# Patient Record
Sex: Female | Born: 1975 | Race: Asian | Hispanic: No | Marital: Married | Smoking: Never smoker
Health system: Southern US, Community
[De-identification: ages and names within clinical notes are randomized; demographics above are authoritative.]

## PROBLEM LIST (undated history)

## (undated) DIAGNOSIS — E039 Hypothyroidism, unspecified: Secondary | ICD-10-CM

## (undated) DIAGNOSIS — K589 Irritable bowel syndrome without diarrhea: Secondary | ICD-10-CM

## (undated) DIAGNOSIS — J309 Allergic rhinitis, unspecified: Secondary | ICD-10-CM

## (undated) DIAGNOSIS — E7800 Pure hypercholesterolemia, unspecified: Secondary | ICD-10-CM

## (undated) DIAGNOSIS — F331 Major depressive disorder, recurrent, moderate: Secondary | ICD-10-CM

## (undated) DIAGNOSIS — N643 Galactorrhea not associated with childbirth: Secondary | ICD-10-CM

## (undated) DIAGNOSIS — F32A Depression, unspecified: Secondary | ICD-10-CM

## (undated) HISTORY — DX: Pure hypercholesterolemia, unspecified: E78.00

## (undated) HISTORY — DX: Irritable bowel syndrome, unspecified: K58.9

## (undated) HISTORY — DX: Major depressive disorder, recurrent, moderate: F33.1

## (undated) HISTORY — DX: Galactorrhea not associated with childbirth: N64.3

## (undated) HISTORY — DX: Depression, unspecified: F32.A

## (undated) HISTORY — DX: Hypothyroidism, unspecified: E03.9

## (undated) HISTORY — DX: Allergic rhinitis, unspecified: J30.9

---

## 2006-07-08 ENCOUNTER — Inpatient Hospital Stay (HOSPITAL_COMMUNITY): Admission: AD | Admit: 2006-07-08 | Discharge: 2006-07-08 | Payer: Self-pay | Admitting: Obstetrics and Gynecology

## 2007-09-23 IMAGING — US US PELVIS COMPLETE MODIFY
1 series · 14 of 25 positions shown · non-contrast
Comparison: none

CLINICAL DATA: Right-sided pelvic pain, abnormal uterine bleeding.  
 TRANSABDOMINAL AND TRANSVAGINAL PELVIC ULTRASOUND:
TECHNIQUE: Both transabdominal and transvaginal ultrasound examinations of the pelvis were performed including evaluation of the uterus, ovaries, adnexal regions, and pelvic cul-de-sac.

[Series 1: us pelvis complete modify · 0.24mm/px · 14 of 85 slices shown]
[im 1/85]
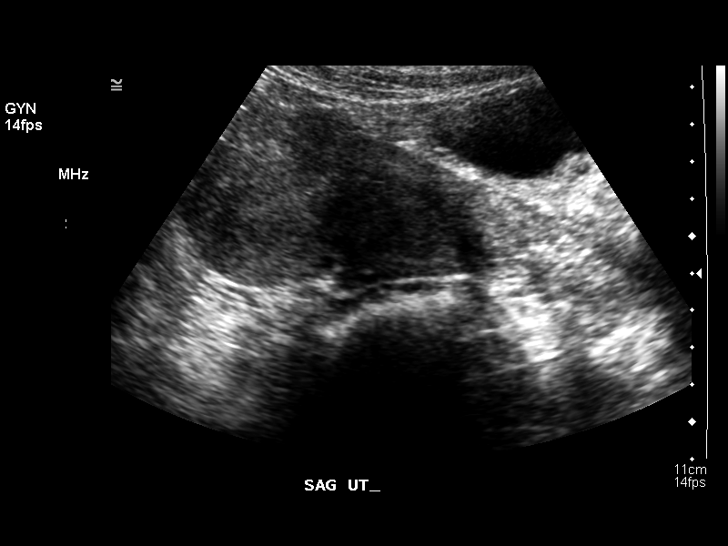
[im 8/85]
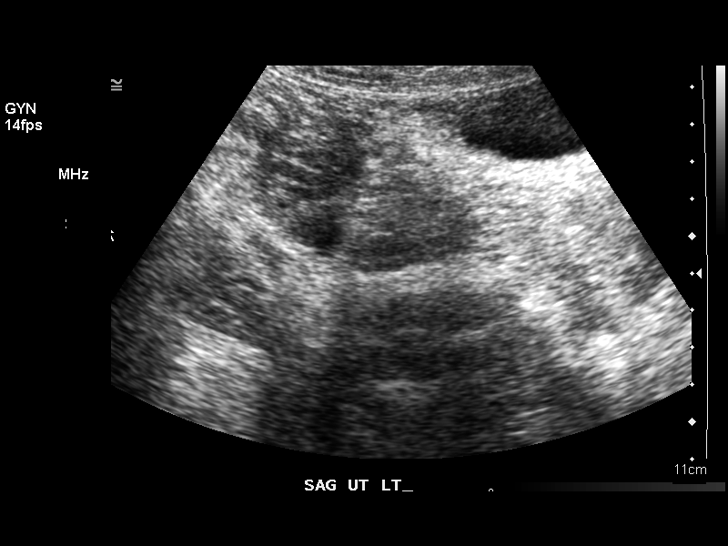
[im 15/85]
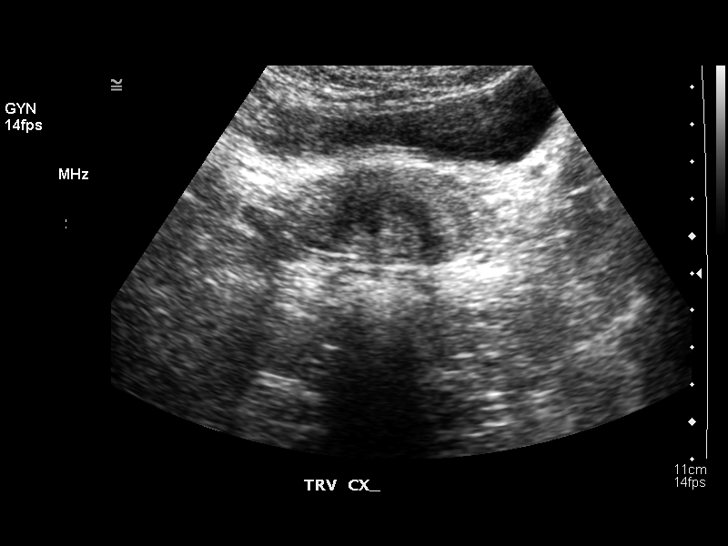
[im 22/85]
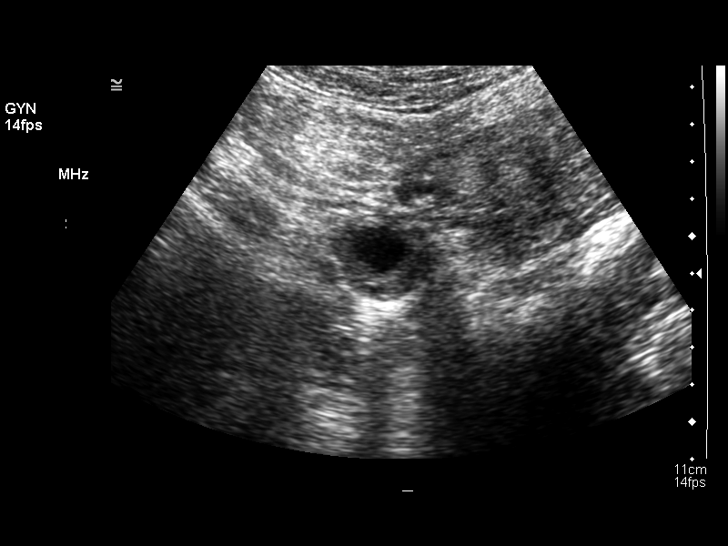
[im 29/85]
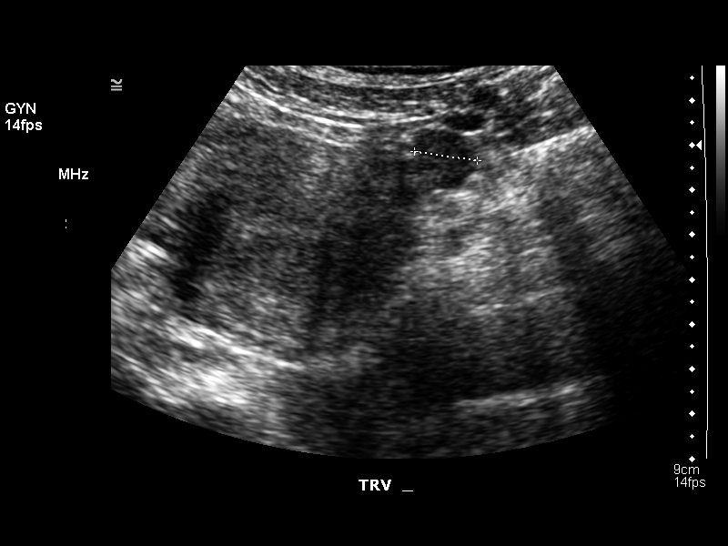
[im 32/85]
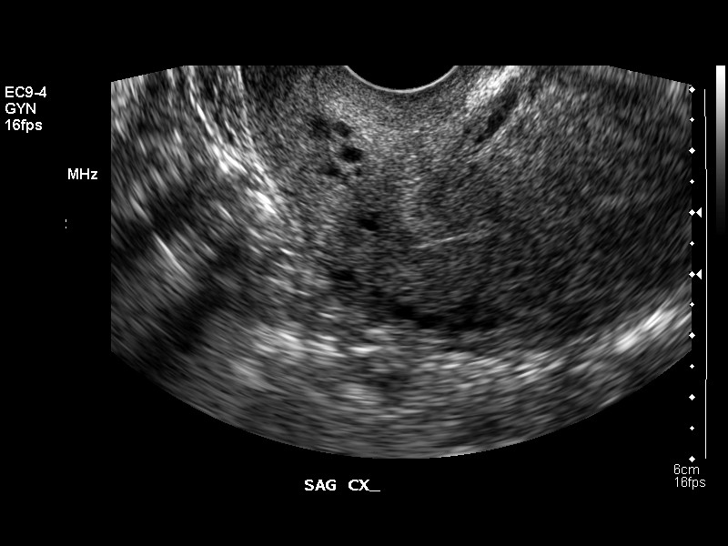
[im 39/85]
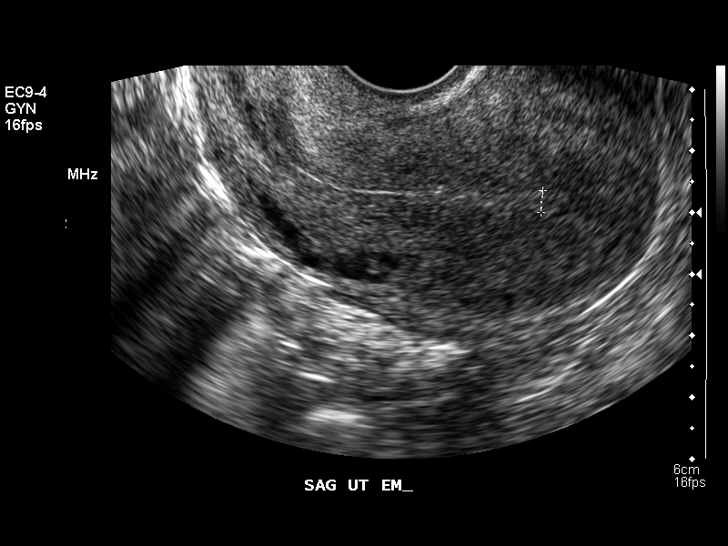
[im 46/85]
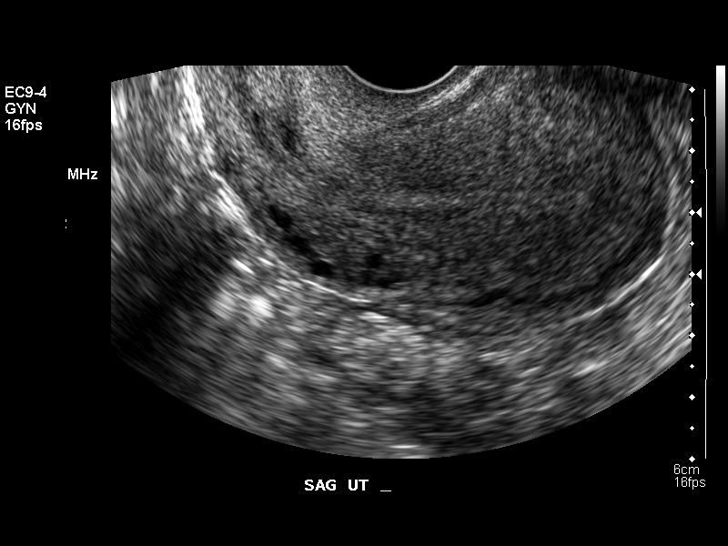
[im 53/85]
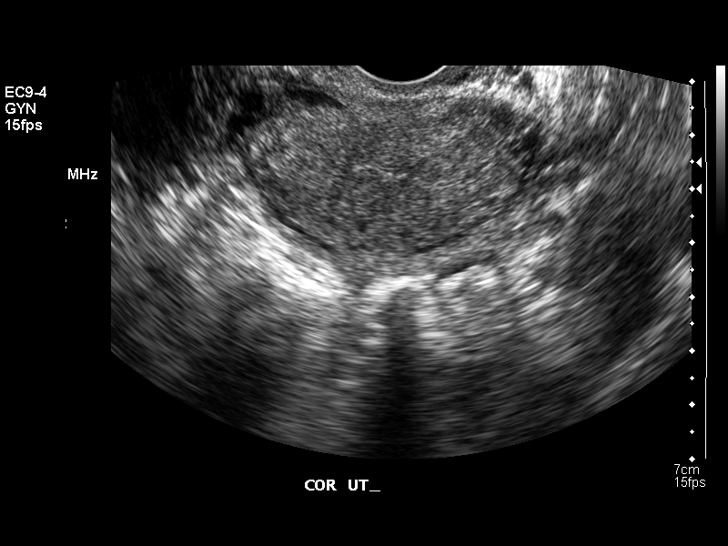
[im 57/85]
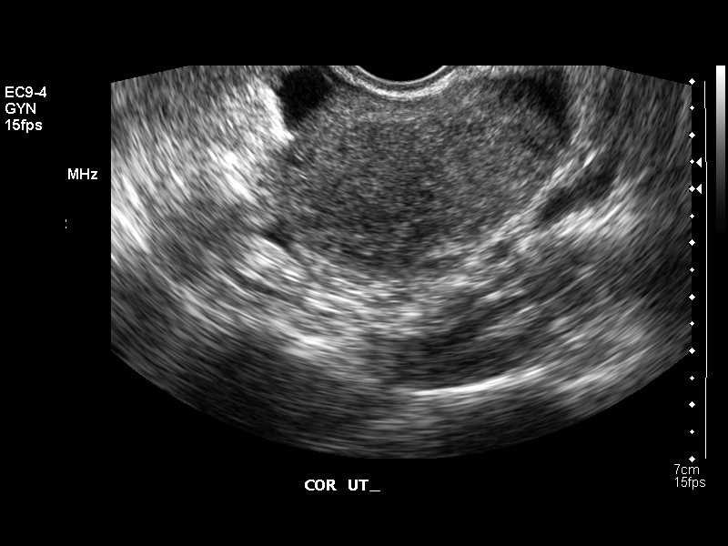
[im 64/85]
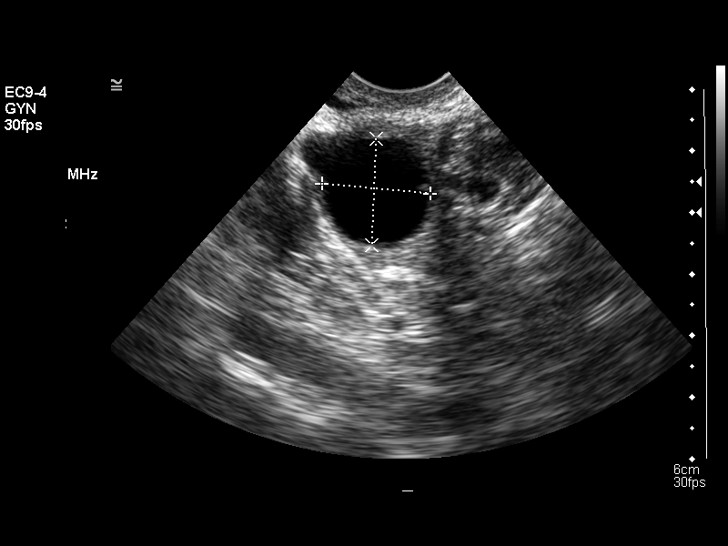
[im 71/85]
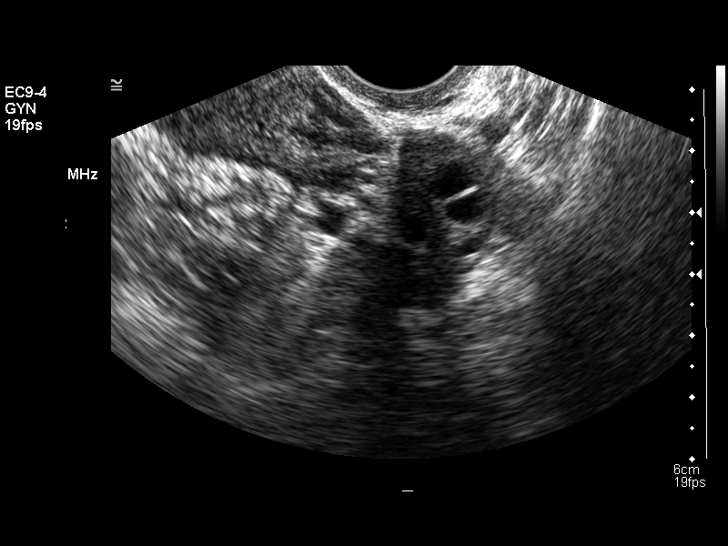
[im 78/85]
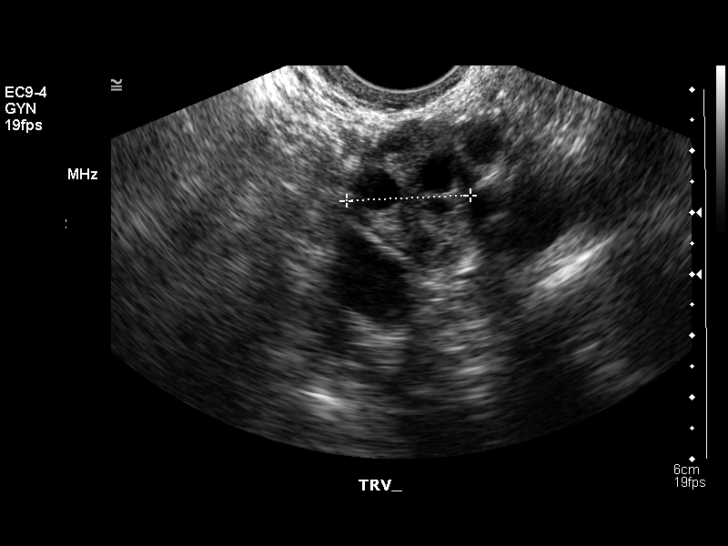
[im 85/85]
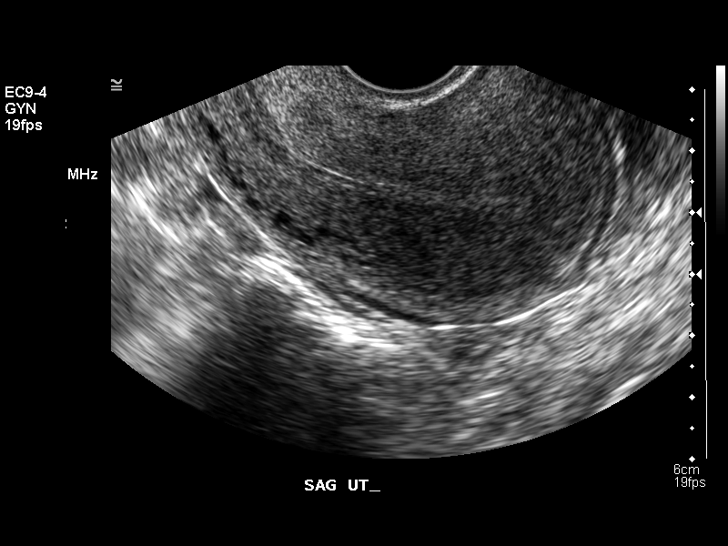

[14 of 25 positions shown; findings below may reference images not displayed]

FINDINGS: The uterus is retroverted but is normal in size measuring approximately 9 cm in length.  No fibroids or other uterine masses are seen.  Thin endometrium is seen, measuring 4 mm.  
 Both ovaries are visualized and are normal in appearance.  There is no evidence of ovarian masses.  No other adnexal masses or free fluid are identified by either transabdominal or transvaginal ultrasound.
IMPRESSION: 1.  Normal retroverted uterus.  Thin endometrium measuring 4 mm.
 2.  Normal ovaries.  No evidence of adnexal mass or free fluid.

## 2007-12-01 ENCOUNTER — Ambulatory Visit: Payer: Self-pay | Admitting: Family Medicine

## 2007-12-01 ENCOUNTER — Encounter: Payer: Self-pay | Admitting: Family Medicine

## 2007-12-01 LAB — CONVERTED CEMR LAB
Antibody Screen: NEGATIVE
Basophils Absolute: 0 10*3/uL (ref 0.0–0.1)
Basophils Relative: 0 % (ref 0–1)
Eosinophils Absolute: 0 10*3/uL (ref 0.0–0.7)
Hemoglobin: 12 g/dL (ref 12.0–15.0)
MCHC: 34.5 g/dL (ref 30.0–36.0)
MCV: 91.3 fL (ref 78.0–100.0)
Monocytes Absolute: 0.5 10*3/uL (ref 0.1–1.0)
Monocytes Relative: 6 % (ref 3–12)
RBC: 3.81 M/uL — ABNORMAL LOW (ref 3.87–5.11)
RDW: 14.3 % (ref 11.5–15.5)

## 2007-12-08 ENCOUNTER — Ambulatory Visit: Payer: Self-pay | Admitting: Family Medicine

## 2007-12-16 ENCOUNTER — Ambulatory Visit: Payer: Self-pay | Admitting: Family Medicine

## 2007-12-16 ENCOUNTER — Encounter: Payer: Self-pay | Admitting: Family Medicine

## 2007-12-16 LAB — CONVERTED CEMR LAB
GTT, 1 hr: 120 mg/dL
Protein, U semiquant: NEGATIVE

## 2008-01-12 ENCOUNTER — Encounter: Payer: Self-pay | Admitting: Family Medicine

## 2008-02-15 ENCOUNTER — Ambulatory Visit: Payer: Self-pay | Admitting: Family Medicine

## 2008-02-15 LAB — CONVERTED CEMR LAB: Protein, U semiquant: NEGATIVE

## 2008-03-07 ENCOUNTER — Telehealth: Payer: Self-pay | Admitting: *Deleted

## 2008-03-16 ENCOUNTER — Ambulatory Visit: Payer: Self-pay | Admitting: Family Medicine

## 2008-03-16 ENCOUNTER — Encounter: Payer: Self-pay | Admitting: Family Medicine

## 2008-03-16 LAB — CONVERTED CEMR LAB: Glucose, Urine, Semiquant: NEGATIVE

## 2008-03-17 ENCOUNTER — Encounter: Payer: Self-pay | Admitting: Family Medicine

## 2008-03-17 ENCOUNTER — Ambulatory Visit: Payer: Self-pay | Admitting: Family Medicine

## 2008-04-05 ENCOUNTER — Ambulatory Visit: Payer: Self-pay | Admitting: Family Medicine

## 2008-04-05 ENCOUNTER — Encounter: Payer: Self-pay | Admitting: Family Medicine

## 2008-04-20 ENCOUNTER — Encounter: Payer: Self-pay | Admitting: Family Medicine

## 2008-04-20 ENCOUNTER — Ambulatory Visit: Payer: Self-pay | Admitting: Family Medicine

## 2008-04-20 LAB — CONVERTED CEMR LAB: Glucose, Urine, Semiquant: NEGATIVE

## 2008-05-02 ENCOUNTER — Ambulatory Visit: Payer: Self-pay | Admitting: Family Medicine

## 2008-05-02 LAB — CONVERTED CEMR LAB
Glucose, Urine, Semiquant: NEGATIVE
Protein, U semiquant: NEGATIVE

## 2008-05-09 ENCOUNTER — Encounter: Payer: Self-pay | Admitting: Family Medicine

## 2008-05-09 ENCOUNTER — Ambulatory Visit: Payer: Self-pay | Admitting: Family Medicine

## 2008-05-09 LAB — CONVERTED CEMR LAB: Protein, U semiquant: NEGATIVE

## 2008-05-16 ENCOUNTER — Encounter: Payer: Self-pay | Admitting: Family Medicine

## 2008-05-16 ENCOUNTER — Ambulatory Visit: Payer: Self-pay | Admitting: Family Medicine

## 2008-05-18 LAB — CONVERTED CEMR LAB: Chlamydia, DNA Probe: NEGATIVE

## 2008-05-19 ENCOUNTER — Telehealth: Payer: Self-pay | Admitting: *Deleted

## 2008-05-20 ENCOUNTER — Ambulatory Visit: Payer: Self-pay | Admitting: Obstetrics & Gynecology

## 2008-05-20 ENCOUNTER — Inpatient Hospital Stay (HOSPITAL_COMMUNITY): Admission: AD | Admit: 2008-05-20 | Discharge: 2008-05-20 | Payer: Self-pay | Admitting: Obstetrics & Gynecology

## 2008-05-23 ENCOUNTER — Encounter: Payer: Self-pay | Admitting: Family Medicine

## 2008-05-23 ENCOUNTER — Ambulatory Visit: Payer: Self-pay | Admitting: Family Medicine

## 2008-05-23 LAB — CONVERTED CEMR LAB
Glucose, Urine, Semiquant: NEGATIVE
Protein, U semiquant: NEGATIVE

## 2008-05-30 ENCOUNTER — Encounter: Payer: Self-pay | Admitting: Family Medicine

## 2008-05-30 ENCOUNTER — Ambulatory Visit: Payer: Self-pay | Admitting: Sports Medicine

## 2008-05-30 LAB — CONVERTED CEMR LAB: Glucose, Urine, Semiquant: NEGATIVE

## 2008-06-09 ENCOUNTER — Encounter: Payer: Self-pay | Admitting: Family Medicine

## 2008-06-09 ENCOUNTER — Ambulatory Visit: Payer: Self-pay | Admitting: Sports Medicine

## 2008-06-16 ENCOUNTER — Inpatient Hospital Stay (HOSPITAL_COMMUNITY): Admission: AD | Admit: 2008-06-16 | Discharge: 2008-06-18 | Payer: Self-pay | Admitting: Family Medicine

## 2008-06-16 ENCOUNTER — Ambulatory Visit: Payer: Self-pay | Admitting: Family Medicine

## 2008-07-26 ENCOUNTER — Encounter: Payer: Self-pay | Admitting: Family Medicine

## 2008-08-04 ENCOUNTER — Encounter: Payer: Self-pay | Admitting: *Deleted

## 2008-08-10 ENCOUNTER — Encounter: Payer: Self-pay | Admitting: *Deleted

## 2011-08-15 LAB — URINALYSIS, ROUTINE W REFLEX MICROSCOPIC
Glucose, UA: NEGATIVE
Hgb urine dipstick: NEGATIVE
Ketones, ur: 40 — AB
pH: 6.5

## 2011-08-16 LAB — CBC
HCT: 33 — ABNORMAL LOW
Hemoglobin: 10.9 — ABNORMAL LOW
RDW: 15.8 — ABNORMAL HIGH
WBC: 9.2

## 2016-02-19 DIAGNOSIS — N644 Mastodynia: Secondary | ICD-10-CM | POA: Diagnosis not present

## 2016-03-11 DIAGNOSIS — L910 Hypertrophic scar: Secondary | ICD-10-CM | POA: Diagnosis not present

## 2016-04-09 DIAGNOSIS — E039 Hypothyroidism, unspecified: Secondary | ICD-10-CM | POA: Diagnosis not present

## 2016-04-16 DIAGNOSIS — E039 Hypothyroidism, unspecified: Secondary | ICD-10-CM | POA: Diagnosis not present

## 2016-04-16 DIAGNOSIS — E7800 Pure hypercholesterolemia, unspecified: Secondary | ICD-10-CM | POA: Diagnosis not present

## 2016-04-16 DIAGNOSIS — K581 Irritable bowel syndrome with constipation: Secondary | ICD-10-CM | POA: Diagnosis not present

## 2016-04-16 DIAGNOSIS — J309 Allergic rhinitis, unspecified: Secondary | ICD-10-CM | POA: Diagnosis not present

## 2016-04-23 DIAGNOSIS — M7632 Iliotibial band syndrome, left leg: Secondary | ICD-10-CM | POA: Diagnosis not present

## 2016-04-23 DIAGNOSIS — M25561 Pain in right knee: Secondary | ICD-10-CM | POA: Diagnosis not present

## 2016-05-11 DIAGNOSIS — O926 Galactorrhea: Secondary | ICD-10-CM | POA: Diagnosis not present

## 2016-05-11 DIAGNOSIS — J0100 Acute maxillary sinusitis, unspecified: Secondary | ICD-10-CM | POA: Diagnosis not present

## 2016-05-11 DIAGNOSIS — E7800 Pure hypercholesterolemia, unspecified: Secondary | ICD-10-CM | POA: Diagnosis not present

## 2016-05-11 DIAGNOSIS — K581 Irritable bowel syndrome with constipation: Secondary | ICD-10-CM | POA: Diagnosis not present

## 2016-06-10 DIAGNOSIS — H6691 Otitis media, unspecified, right ear: Secondary | ICD-10-CM | POA: Diagnosis not present

## 2016-06-10 DIAGNOSIS — E7800 Pure hypercholesterolemia, unspecified: Secondary | ICD-10-CM | POA: Diagnosis not present

## 2016-06-10 DIAGNOSIS — R42 Dizziness and giddiness: Secondary | ICD-10-CM | POA: Diagnosis not present

## 2016-07-01 DIAGNOSIS — Z0000 Encounter for general adult medical examination without abnormal findings: Secondary | ICD-10-CM | POA: Diagnosis not present

## 2016-07-01 DIAGNOSIS — N920 Excessive and frequent menstruation with regular cycle: Secondary | ICD-10-CM | POA: Diagnosis not present

## 2016-07-01 DIAGNOSIS — Z124 Encounter for screening for malignant neoplasm of cervix: Secondary | ICD-10-CM | POA: Diagnosis not present

## 2016-07-01 DIAGNOSIS — Z113 Encounter for screening for infections with a predominantly sexual mode of transmission: Secondary | ICD-10-CM | POA: Diagnosis not present

## 2016-07-11 DIAGNOSIS — N859 Noninflammatory disorder of uterus, unspecified: Secondary | ICD-10-CM | POA: Diagnosis not present

## 2016-07-11 DIAGNOSIS — Z6821 Body mass index (BMI) 21.0-21.9, adult: Secondary | ICD-10-CM | POA: Diagnosis not present

## 2016-07-11 DIAGNOSIS — N924 Excessive bleeding in the premenopausal period: Secondary | ICD-10-CM | POA: Diagnosis not present

## 2016-07-11 DIAGNOSIS — R109 Unspecified abdominal pain: Secondary | ICD-10-CM | POA: Diagnosis not present

## 2016-07-17 DIAGNOSIS — E7800 Pure hypercholesterolemia, unspecified: Secondary | ICD-10-CM | POA: Diagnosis not present

## 2016-07-17 DIAGNOSIS — E039 Hypothyroidism, unspecified: Secondary | ICD-10-CM | POA: Diagnosis not present

## 2016-07-17 DIAGNOSIS — J309 Allergic rhinitis, unspecified: Secondary | ICD-10-CM | POA: Diagnosis not present

## 2016-07-17 DIAGNOSIS — O926 Galactorrhea: Secondary | ICD-10-CM | POA: Diagnosis not present

## 2016-08-05 DIAGNOSIS — N924 Excessive bleeding in the premenopausal period: Secondary | ICD-10-CM | POA: Diagnosis not present

## 2016-08-05 DIAGNOSIS — Z6821 Body mass index (BMI) 21.0-21.9, adult: Secondary | ICD-10-CM | POA: Diagnosis not present

## 2016-08-05 DIAGNOSIS — N939 Abnormal uterine and vaginal bleeding, unspecified: Secondary | ICD-10-CM | POA: Diagnosis not present

## 2016-08-05 DIAGNOSIS — N840 Polyp of corpus uteri: Secondary | ICD-10-CM | POA: Diagnosis not present

## 2016-08-07 DIAGNOSIS — H9201 Otalgia, right ear: Secondary | ICD-10-CM | POA: Diagnosis not present

## 2016-08-07 DIAGNOSIS — H7291 Unspecified perforation of tympanic membrane, right ear: Secondary | ICD-10-CM | POA: Diagnosis not present

## 2016-08-08 DIAGNOSIS — L910 Hypertrophic scar: Secondary | ICD-10-CM | POA: Diagnosis not present

## 2016-08-14 DIAGNOSIS — Z6821 Body mass index (BMI) 21.0-21.9, adult: Secondary | ICD-10-CM | POA: Diagnosis not present

## 2016-08-14 DIAGNOSIS — N939 Abnormal uterine and vaginal bleeding, unspecified: Secondary | ICD-10-CM | POA: Diagnosis not present

## 2016-08-14 DIAGNOSIS — N840 Polyp of corpus uteri: Secondary | ICD-10-CM | POA: Diagnosis not present

## 2016-08-14 DIAGNOSIS — Z01818 Encounter for other preprocedural examination: Secondary | ICD-10-CM | POA: Diagnosis not present

## 2016-08-15 DIAGNOSIS — H9201 Otalgia, right ear: Secondary | ICD-10-CM | POA: Diagnosis not present

## 2016-08-15 DIAGNOSIS — H7291 Unspecified perforation of tympanic membrane, right ear: Secondary | ICD-10-CM | POA: Diagnosis not present

## 2016-08-27 DIAGNOSIS — N939 Abnormal uterine and vaginal bleeding, unspecified: Secondary | ICD-10-CM | POA: Diagnosis not present

## 2016-08-27 DIAGNOSIS — N8500 Endometrial hyperplasia, unspecified: Secondary | ICD-10-CM | POA: Diagnosis not present

## 2016-08-27 DIAGNOSIS — N840 Polyp of corpus uteri: Secondary | ICD-10-CM | POA: Diagnosis not present

## 2016-09-02 DIAGNOSIS — Z09 Encounter for follow-up examination after completed treatment for conditions other than malignant neoplasm: Secondary | ICD-10-CM | POA: Diagnosis not present

## 2016-09-02 DIAGNOSIS — N939 Abnormal uterine and vaginal bleeding, unspecified: Secondary | ICD-10-CM | POA: Diagnosis not present

## 2016-09-02 DIAGNOSIS — N840 Polyp of corpus uteri: Secondary | ICD-10-CM | POA: Diagnosis not present

## 2016-10-22 DIAGNOSIS — O926 Galactorrhea: Secondary | ICD-10-CM | POA: Diagnosis not present

## 2016-10-22 DIAGNOSIS — E039 Hypothyroidism, unspecified: Secondary | ICD-10-CM | POA: Diagnosis not present

## 2016-10-22 DIAGNOSIS — E7800 Pure hypercholesterolemia, unspecified: Secondary | ICD-10-CM | POA: Diagnosis not present

## 2016-10-22 DIAGNOSIS — B079 Viral wart, unspecified: Secondary | ICD-10-CM | POA: Diagnosis not present

## 2016-10-29 DIAGNOSIS — E7800 Pure hypercholesterolemia, unspecified: Secondary | ICD-10-CM | POA: Diagnosis not present

## 2016-10-29 DIAGNOSIS — J309 Allergic rhinitis, unspecified: Secondary | ICD-10-CM | POA: Diagnosis not present

## 2016-10-29 DIAGNOSIS — E039 Hypothyroidism, unspecified: Secondary | ICD-10-CM | POA: Diagnosis not present

## 2016-10-29 DIAGNOSIS — O926 Galactorrhea: Secondary | ICD-10-CM | POA: Diagnosis not present

## 2016-10-29 DIAGNOSIS — B079 Viral wart, unspecified: Secondary | ICD-10-CM | POA: Diagnosis not present

## 2016-11-05 DIAGNOSIS — E7800 Pure hypercholesterolemia, unspecified: Secondary | ICD-10-CM | POA: Diagnosis not present

## 2016-11-05 DIAGNOSIS — J309 Allergic rhinitis, unspecified: Secondary | ICD-10-CM | POA: Diagnosis not present

## 2016-11-05 DIAGNOSIS — O926 Galactorrhea: Secondary | ICD-10-CM | POA: Diagnosis not present

## 2016-11-05 DIAGNOSIS — E039 Hypothyroidism, unspecified: Secondary | ICD-10-CM | POA: Diagnosis not present

## 2016-11-05 DIAGNOSIS — B079 Viral wart, unspecified: Secondary | ICD-10-CM | POA: Diagnosis not present

## 2016-12-25 DIAGNOSIS — H5213 Myopia, bilateral: Secondary | ICD-10-CM | POA: Diagnosis not present

## 2016-12-25 DIAGNOSIS — H52203 Unspecified astigmatism, bilateral: Secondary | ICD-10-CM | POA: Diagnosis not present

## 2016-12-25 DIAGNOSIS — T8189XA Other complications of procedures, not elsewhere classified, initial encounter: Secondary | ICD-10-CM | POA: Diagnosis not present

## 2017-01-16 DIAGNOSIS — L2084 Intrinsic (allergic) eczema: Secondary | ICD-10-CM | POA: Diagnosis not present

## 2017-02-04 DIAGNOSIS — R5382 Chronic fatigue, unspecified: Secondary | ICD-10-CM | POA: Diagnosis not present

## 2017-02-04 DIAGNOSIS — E039 Hypothyroidism, unspecified: Secondary | ICD-10-CM | POA: Diagnosis not present

## 2017-02-04 DIAGNOSIS — E7800 Pure hypercholesterolemia, unspecified: Secondary | ICD-10-CM | POA: Diagnosis not present

## 2017-02-04 DIAGNOSIS — L309 Dermatitis, unspecified: Secondary | ICD-10-CM | POA: Diagnosis not present

## 2017-02-13 DIAGNOSIS — T8189XA Other complications of procedures, not elsewhere classified, initial encounter: Secondary | ICD-10-CM | POA: Diagnosis not present

## 2017-02-21 DIAGNOSIS — D485 Neoplasm of uncertain behavior of skin: Secondary | ICD-10-CM | POA: Diagnosis not present

## 2017-02-21 DIAGNOSIS — B081 Molluscum contagiosum: Secondary | ICD-10-CM | POA: Diagnosis not present

## 2017-03-25 DIAGNOSIS — D485 Neoplasm of uncertain behavior of skin: Secondary | ICD-10-CM | POA: Diagnosis not present

## 2017-03-25 DIAGNOSIS — L308 Other specified dermatitis: Secondary | ICD-10-CM | POA: Diagnosis not present

## 2017-04-03 DIAGNOSIS — E039 Hypothyroidism, unspecified: Secondary | ICD-10-CM | POA: Diagnosis not present

## 2017-04-25 DIAGNOSIS — Z124 Encounter for screening for malignant neoplasm of cervix: Secondary | ICD-10-CM | POA: Diagnosis not present

## 2017-04-25 DIAGNOSIS — N644 Mastodynia: Secondary | ICD-10-CM | POA: Diagnosis not present

## 2017-04-25 DIAGNOSIS — Z01419 Encounter for gynecological examination (general) (routine) without abnormal findings: Secondary | ICD-10-CM | POA: Diagnosis not present

## 2017-04-25 DIAGNOSIS — Z113 Encounter for screening for infections with a predominantly sexual mode of transmission: Secondary | ICD-10-CM | POA: Diagnosis not present

## 2017-04-28 DIAGNOSIS — E7800 Pure hypercholesterolemia, unspecified: Secondary | ICD-10-CM | POA: Diagnosis not present

## 2017-04-28 DIAGNOSIS — K581 Irritable bowel syndrome with constipation: Secondary | ICD-10-CM | POA: Diagnosis not present

## 2017-04-28 DIAGNOSIS — E039 Hypothyroidism, unspecified: Secondary | ICD-10-CM | POA: Diagnosis not present

## 2017-04-28 DIAGNOSIS — J309 Allergic rhinitis, unspecified: Secondary | ICD-10-CM | POA: Diagnosis not present

## 2017-07-29 DIAGNOSIS — R42 Dizziness and giddiness: Secondary | ICD-10-CM | POA: Diagnosis not present

## 2017-07-29 DIAGNOSIS — E7800 Pure hypercholesterolemia, unspecified: Secondary | ICD-10-CM | POA: Diagnosis not present

## 2017-07-29 DIAGNOSIS — J309 Allergic rhinitis, unspecified: Secondary | ICD-10-CM | POA: Diagnosis not present

## 2017-07-29 DIAGNOSIS — E039 Hypothyroidism, unspecified: Secondary | ICD-10-CM | POA: Diagnosis not present

## 2017-07-31 DIAGNOSIS — E039 Hypothyroidism, unspecified: Secondary | ICD-10-CM | POA: Diagnosis not present

## 2017-08-18 DIAGNOSIS — H6691 Otitis media, unspecified, right ear: Secondary | ICD-10-CM | POA: Diagnosis not present

## 2017-08-18 DIAGNOSIS — E039 Hypothyroidism, unspecified: Secondary | ICD-10-CM | POA: Diagnosis not present

## 2017-08-18 DIAGNOSIS — J309 Allergic rhinitis, unspecified: Secondary | ICD-10-CM | POA: Diagnosis not present

## 2017-08-18 DIAGNOSIS — E7800 Pure hypercholesterolemia, unspecified: Secondary | ICD-10-CM | POA: Diagnosis not present

## 2017-08-27 DIAGNOSIS — H66001 Acute suppurative otitis media without spontaneous rupture of ear drum, right ear: Secondary | ICD-10-CM | POA: Diagnosis not present

## 2017-08-27 DIAGNOSIS — H7291 Unspecified perforation of tympanic membrane, right ear: Secondary | ICD-10-CM | POA: Diagnosis not present

## 2017-09-02 DIAGNOSIS — H7291 Unspecified perforation of tympanic membrane, right ear: Secondary | ICD-10-CM | POA: Diagnosis not present

## 2017-09-02 DIAGNOSIS — H6691 Otitis media, unspecified, right ear: Secondary | ICD-10-CM | POA: Diagnosis not present

## 2017-09-02 DIAGNOSIS — H9201 Otalgia, right ear: Secondary | ICD-10-CM | POA: Diagnosis not present

## 2017-09-16 ENCOUNTER — Encounter: Payer: Self-pay | Admitting: *Deleted

## 2017-09-16 ENCOUNTER — Telehealth: Payer: Self-pay | Admitting: *Deleted

## 2017-09-16 ENCOUNTER — Ambulatory Visit: Payer: BLUE CROSS/BLUE SHIELD | Admitting: Neurology

## 2017-09-16 NOTE — Telephone Encounter (Signed)
No showed new patient appointment. 

## 2017-09-17 ENCOUNTER — Encounter: Payer: Self-pay | Admitting: Neurology

## 2017-11-03 DIAGNOSIS — E7800 Pure hypercholesterolemia, unspecified: Secondary | ICD-10-CM | POA: Diagnosis not present

## 2017-11-03 DIAGNOSIS — Z1331 Encounter for screening for depression: Secondary | ICD-10-CM | POA: Diagnosis not present

## 2017-11-03 DIAGNOSIS — J309 Allergic rhinitis, unspecified: Secondary | ICD-10-CM | POA: Diagnosis not present

## 2017-11-03 DIAGNOSIS — E039 Hypothyroidism, unspecified: Secondary | ICD-10-CM | POA: Diagnosis not present

## 2017-11-03 DIAGNOSIS — K581 Irritable bowel syndrome with constipation: Secondary | ICD-10-CM | POA: Diagnosis not present

## 2018-01-12 DIAGNOSIS — E039 Hypothyroidism, unspecified: Secondary | ICD-10-CM | POA: Diagnosis not present

## 2018-01-20 DIAGNOSIS — M545 Low back pain: Secondary | ICD-10-CM | POA: Diagnosis not present

## 2018-01-20 DIAGNOSIS — J309 Allergic rhinitis, unspecified: Secondary | ICD-10-CM | POA: Diagnosis not present

## 2018-01-20 DIAGNOSIS — E7800 Pure hypercholesterolemia, unspecified: Secondary | ICD-10-CM | POA: Diagnosis not present

## 2018-01-20 DIAGNOSIS — E039 Hypothyroidism, unspecified: Secondary | ICD-10-CM | POA: Diagnosis not present

## 2018-04-20 DIAGNOSIS — N643 Galactorrhea not associated with childbirth: Secondary | ICD-10-CM | POA: Diagnosis not present

## 2018-04-20 DIAGNOSIS — J309 Allergic rhinitis, unspecified: Secondary | ICD-10-CM | POA: Diagnosis not present

## 2018-04-20 DIAGNOSIS — J208 Acute bronchitis due to other specified organisms: Secondary | ICD-10-CM | POA: Diagnosis not present

## 2018-04-20 DIAGNOSIS — K581 Irritable bowel syndrome with constipation: Secondary | ICD-10-CM | POA: Diagnosis not present

## 2018-04-27 DIAGNOSIS — N643 Galactorrhea not associated with childbirth: Secondary | ICD-10-CM | POA: Diagnosis not present

## 2018-04-27 DIAGNOSIS — J309 Allergic rhinitis, unspecified: Secondary | ICD-10-CM | POA: Diagnosis not present

## 2018-04-27 DIAGNOSIS — J208 Acute bronchitis due to other specified organisms: Secondary | ICD-10-CM | POA: Diagnosis not present

## 2018-04-27 DIAGNOSIS — K581 Irritable bowel syndrome with constipation: Secondary | ICD-10-CM | POA: Diagnosis not present

## 2018-05-16 DIAGNOSIS — R946 Abnormal results of thyroid function studies: Secondary | ICD-10-CM | POA: Diagnosis not present

## 2018-05-16 DIAGNOSIS — E039 Hypothyroidism, unspecified: Secondary | ICD-10-CM | POA: Diagnosis not present

## 2018-05-16 DIAGNOSIS — E7800 Pure hypercholesterolemia, unspecified: Secondary | ICD-10-CM | POA: Diagnosis not present

## 2018-05-16 DIAGNOSIS — J309 Allergic rhinitis, unspecified: Secondary | ICD-10-CM | POA: Diagnosis not present

## 2018-05-16 DIAGNOSIS — K581 Irritable bowel syndrome with constipation: Secondary | ICD-10-CM | POA: Diagnosis not present

## 2018-05-16 DIAGNOSIS — N643 Galactorrhea not associated with childbirth: Secondary | ICD-10-CM | POA: Diagnosis not present

## 2018-06-02 DIAGNOSIS — M94261 Chondromalacia, right knee: Secondary | ICD-10-CM | POA: Diagnosis not present

## 2018-06-02 DIAGNOSIS — M25561 Pain in right knee: Secondary | ICD-10-CM | POA: Diagnosis not present

## 2018-07-02 DIAGNOSIS — H9201 Otalgia, right ear: Secondary | ICD-10-CM | POA: Diagnosis not present

## 2018-07-02 DIAGNOSIS — H8111 Benign paroxysmal vertigo, right ear: Secondary | ICD-10-CM | POA: Diagnosis not present

## 2018-07-02 DIAGNOSIS — M542 Cervicalgia: Secondary | ICD-10-CM | POA: Diagnosis not present

## 2018-07-02 DIAGNOSIS — H7291 Unspecified perforation of tympanic membrane, right ear: Secondary | ICD-10-CM | POA: Diagnosis not present

## 2018-07-14 DIAGNOSIS — Z01419 Encounter for gynecological examination (general) (routine) without abnormal findings: Secondary | ICD-10-CM | POA: Diagnosis not present

## 2018-08-03 DIAGNOSIS — Z1231 Encounter for screening mammogram for malignant neoplasm of breast: Secondary | ICD-10-CM | POA: Diagnosis not present

## 2018-08-18 DIAGNOSIS — E039 Hypothyroidism, unspecified: Secondary | ICD-10-CM | POA: Diagnosis not present

## 2018-08-18 DIAGNOSIS — J309 Allergic rhinitis, unspecified: Secondary | ICD-10-CM | POA: Diagnosis not present

## 2018-08-18 DIAGNOSIS — N643 Galactorrhea not associated with childbirth: Secondary | ICD-10-CM | POA: Diagnosis not present

## 2018-08-18 DIAGNOSIS — E7800 Pure hypercholesterolemia, unspecified: Secondary | ICD-10-CM | POA: Diagnosis not present

## 2018-09-29 DIAGNOSIS — N920 Excessive and frequent menstruation with regular cycle: Secondary | ICD-10-CM | POA: Diagnosis not present

## 2018-09-29 DIAGNOSIS — R102 Pelvic and perineal pain: Secondary | ICD-10-CM | POA: Diagnosis not present

## 2018-09-30 DIAGNOSIS — Z9889 Other specified postprocedural states: Secondary | ICD-10-CM | POA: Diagnosis not present

## 2018-09-30 DIAGNOSIS — N83292 Other ovarian cyst, left side: Secondary | ICD-10-CM | POA: Diagnosis not present

## 2018-09-30 DIAGNOSIS — R102 Pelvic and perineal pain: Secondary | ICD-10-CM | POA: Diagnosis not present

## 2018-09-30 DIAGNOSIS — N854 Malposition of uterus: Secondary | ICD-10-CM | POA: Diagnosis not present

## 2018-09-30 DIAGNOSIS — N8302 Follicular cyst of left ovary: Secondary | ICD-10-CM | POA: Diagnosis not present

## 2018-09-30 DIAGNOSIS — N921 Excessive and frequent menstruation with irregular cycle: Secondary | ICD-10-CM | POA: Diagnosis not present

## 2018-10-26 DIAGNOSIS — K581 Irritable bowel syndrome with constipation: Secondary | ICD-10-CM | POA: Diagnosis not present

## 2018-10-26 DIAGNOSIS — N643 Galactorrhea not associated with childbirth: Secondary | ICD-10-CM | POA: Diagnosis not present

## 2018-10-26 DIAGNOSIS — J309 Allergic rhinitis, unspecified: Secondary | ICD-10-CM | POA: Diagnosis not present

## 2018-10-26 DIAGNOSIS — K2970 Gastritis, unspecified, without bleeding: Secondary | ICD-10-CM | POA: Diagnosis not present

## 2018-11-04 DIAGNOSIS — E039 Hypothyroidism, unspecified: Secondary | ICD-10-CM | POA: Diagnosis not present

## 2018-11-05 DIAGNOSIS — R102 Pelvic and perineal pain: Secondary | ICD-10-CM | POA: Diagnosis not present

## 2018-11-25 DIAGNOSIS — E039 Hypothyroidism, unspecified: Secondary | ICD-10-CM | POA: Diagnosis not present

## 2018-11-25 DIAGNOSIS — J309 Allergic rhinitis, unspecified: Secondary | ICD-10-CM | POA: Diagnosis not present

## 2018-11-25 DIAGNOSIS — R42 Dizziness and giddiness: Secondary | ICD-10-CM | POA: Diagnosis not present

## 2018-11-25 DIAGNOSIS — K2970 Gastritis, unspecified, without bleeding: Secondary | ICD-10-CM | POA: Diagnosis not present

## 2018-11-25 DIAGNOSIS — Z1331 Encounter for screening for depression: Secondary | ICD-10-CM | POA: Diagnosis not present

## 2018-11-25 DIAGNOSIS — E7800 Pure hypercholesterolemia, unspecified: Secondary | ICD-10-CM | POA: Diagnosis not present

## 2018-11-25 DIAGNOSIS — N643 Galactorrhea not associated with childbirth: Secondary | ICD-10-CM | POA: Diagnosis not present

## 2018-11-30 DIAGNOSIS — N643 Galactorrhea not associated with childbirth: Secondary | ICD-10-CM | POA: Diagnosis not present

## 2018-11-30 DIAGNOSIS — K2970 Gastritis, unspecified, without bleeding: Secondary | ICD-10-CM | POA: Diagnosis not present

## 2018-11-30 DIAGNOSIS — K581 Irritable bowel syndrome with constipation: Secondary | ICD-10-CM | POA: Diagnosis not present

## 2018-11-30 DIAGNOSIS — J309 Allergic rhinitis, unspecified: Secondary | ICD-10-CM | POA: Diagnosis not present

## 2018-12-01 DIAGNOSIS — R102 Pelvic and perineal pain: Secondary | ICD-10-CM | POA: Diagnosis not present

## 2018-12-01 DIAGNOSIS — Z09 Encounter for follow-up examination after completed treatment for conditions other than malignant neoplasm: Secondary | ICD-10-CM | POA: Diagnosis not present

## 2018-12-01 DIAGNOSIS — Z8742 Personal history of other diseases of the female genital tract: Secondary | ICD-10-CM | POA: Diagnosis not present

## 2018-12-01 DIAGNOSIS — N854 Malposition of uterus: Secondary | ICD-10-CM | POA: Diagnosis not present

## 2019-01-12 DIAGNOSIS — J309 Allergic rhinitis, unspecified: Secondary | ICD-10-CM | POA: Diagnosis not present

## 2019-01-12 DIAGNOSIS — N643 Galactorrhea not associated with childbirth: Secondary | ICD-10-CM | POA: Diagnosis not present

## 2019-01-12 DIAGNOSIS — Z23 Encounter for immunization: Secondary | ICD-10-CM | POA: Diagnosis not present

## 2019-01-12 DIAGNOSIS — K2970 Gastritis, unspecified, without bleeding: Secondary | ICD-10-CM | POA: Diagnosis not present

## 2019-01-12 DIAGNOSIS — K581 Irritable bowel syndrome with constipation: Secondary | ICD-10-CM | POA: Diagnosis not present

## 2019-03-18 DIAGNOSIS — R42 Dizziness and giddiness: Secondary | ICD-10-CM | POA: Diagnosis not present

## 2019-03-18 DIAGNOSIS — J309 Allergic rhinitis, unspecified: Secondary | ICD-10-CM | POA: Diagnosis not present

## 2019-03-18 DIAGNOSIS — N643 Galactorrhea not associated with childbirth: Secondary | ICD-10-CM | POA: Diagnosis not present

## 2019-03-18 DIAGNOSIS — K2970 Gastritis, unspecified, without bleeding: Secondary | ICD-10-CM | POA: Diagnosis not present

## 2019-05-18 DIAGNOSIS — E039 Hypothyroidism, unspecified: Secondary | ICD-10-CM | POA: Diagnosis not present

## 2019-05-18 DIAGNOSIS — N643 Galactorrhea not associated with childbirth: Secondary | ICD-10-CM | POA: Diagnosis not present

## 2019-05-18 DIAGNOSIS — E7800 Pure hypercholesterolemia, unspecified: Secondary | ICD-10-CM | POA: Diagnosis not present

## 2019-05-18 DIAGNOSIS — K2970 Gastritis, unspecified, without bleeding: Secondary | ICD-10-CM | POA: Diagnosis not present

## 2019-05-18 DIAGNOSIS — R42 Dizziness and giddiness: Secondary | ICD-10-CM | POA: Diagnosis not present

## 2019-05-18 DIAGNOSIS — J309 Allergic rhinitis, unspecified: Secondary | ICD-10-CM | POA: Diagnosis not present

## 2019-07-23 DIAGNOSIS — E039 Hypothyroidism, unspecified: Secondary | ICD-10-CM | POA: Diagnosis not present

## 2019-07-23 DIAGNOSIS — E7800 Pure hypercholesterolemia, unspecified: Secondary | ICD-10-CM | POA: Diagnosis not present

## 2019-07-23 DIAGNOSIS — K2970 Gastritis, unspecified, without bleeding: Secondary | ICD-10-CM | POA: Diagnosis not present

## 2019-07-23 DIAGNOSIS — J309 Allergic rhinitis, unspecified: Secondary | ICD-10-CM | POA: Diagnosis not present

## 2019-07-23 DIAGNOSIS — N643 Galactorrhea not associated with childbirth: Secondary | ICD-10-CM | POA: Diagnosis not present

## 2019-09-13 DIAGNOSIS — N946 Dysmenorrhea, unspecified: Secondary | ICD-10-CM | POA: Diagnosis not present

## 2019-09-13 DIAGNOSIS — R399 Unspecified symptoms and signs involving the genitourinary system: Secondary | ICD-10-CM | POA: Diagnosis not present

## 2019-09-20 DIAGNOSIS — K2970 Gastritis, unspecified, without bleeding: Secondary | ICD-10-CM | POA: Diagnosis not present

## 2019-09-20 DIAGNOSIS — E7800 Pure hypercholesterolemia, unspecified: Secondary | ICD-10-CM | POA: Diagnosis not present

## 2019-09-20 DIAGNOSIS — J309 Allergic rhinitis, unspecified: Secondary | ICD-10-CM | POA: Diagnosis not present

## 2019-09-20 DIAGNOSIS — Z23 Encounter for immunization: Secondary | ICD-10-CM | POA: Diagnosis not present

## 2019-09-20 DIAGNOSIS — M7712 Lateral epicondylitis, left elbow: Secondary | ICD-10-CM | POA: Diagnosis not present

## 2019-09-20 DIAGNOSIS — E039 Hypothyroidism, unspecified: Secondary | ICD-10-CM | POA: Diagnosis not present

## 2019-09-20 DIAGNOSIS — N643 Galactorrhea not associated with childbirth: Secondary | ICD-10-CM | POA: Diagnosis not present

## 2019-10-13 DIAGNOSIS — J309 Allergic rhinitis, unspecified: Secondary | ICD-10-CM | POA: Diagnosis not present

## 2019-10-13 DIAGNOSIS — M7712 Lateral epicondylitis, left elbow: Secondary | ICD-10-CM | POA: Diagnosis not present

## 2019-10-13 DIAGNOSIS — K2970 Gastritis, unspecified, without bleeding: Secondary | ICD-10-CM | POA: Diagnosis not present

## 2019-10-13 DIAGNOSIS — N643 Galactorrhea not associated with childbirth: Secondary | ICD-10-CM | POA: Diagnosis not present

## 2021-09-10 ENCOUNTER — Encounter: Payer: Self-pay | Admitting: Gastroenterology

## 2021-09-26 ENCOUNTER — Ambulatory Visit: Payer: Self-pay | Admitting: Gastroenterology

## 2021-09-27 ENCOUNTER — Encounter: Payer: Self-pay | Admitting: Internal Medicine
